# Patient Record
Sex: Male | Born: 1995 | Race: White | Hispanic: No | Marital: Single | Smoking: Never smoker
Health system: Southern US, Community
[De-identification: ages and names within clinical notes are randomized; demographics above are authoritative.]

## PROBLEM LIST (undated history)

## (undated) DIAGNOSIS — F909 Attention-deficit hyperactivity disorder, unspecified type: Secondary | ICD-10-CM

## (undated) DIAGNOSIS — J302 Other seasonal allergic rhinitis: Secondary | ICD-10-CM

## (undated) HISTORY — DX: Other seasonal allergic rhinitis: J30.2

## (undated) HISTORY — DX: Attention-deficit hyperactivity disorder, unspecified type: F90.9

---
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
## 2016-02-28 DIAGNOSIS — Z7189 Other specified counseling: Secondary | ICD-10-CM | POA: Diagnosis not present

## 2016-02-28 DIAGNOSIS — R5383 Other fatigue: Secondary | ICD-10-CM | POA: Diagnosis not present

## 2016-02-28 DIAGNOSIS — E663 Overweight: Secondary | ICD-10-CM | POA: Diagnosis not present

## 2016-02-28 DIAGNOSIS — Z1389 Encounter for screening for other disorder: Secondary | ICD-10-CM | POA: Diagnosis not present

## 2016-04-22 DIAGNOSIS — Z23 Encounter for immunization: Secondary | ICD-10-CM | POA: Diagnosis not present

## 2016-04-26 ENCOUNTER — Emergency Department
Admission: EM | Admit: 2016-04-26 | Discharge: 2016-04-27 | Disposition: A | Discharge status: Home / Self Care | Payer: Worker's Compensation | Attending: Emergency Medicine | Admitting: Emergency Medicine

## 2016-04-26 ENCOUNTER — Encounter: Payer: Self-pay | Admitting: Emergency Medicine

## 2016-04-26 ENCOUNTER — Emergency Department: Payer: Worker's Compensation

## 2016-04-26 DIAGNOSIS — S71152A Open bite, left thigh, initial encounter: Secondary | ICD-10-CM | POA: Insufficient documentation

## 2016-04-26 DIAGNOSIS — Y9389 Activity, other specified: Secondary | ICD-10-CM | POA: Insufficient documentation

## 2016-04-26 DIAGNOSIS — S70312A Abrasion, left thigh, initial encounter: Secondary | ICD-10-CM

## 2016-04-26 DIAGNOSIS — Z79899 Other long term (current) drug therapy: Secondary | ICD-10-CM | POA: Insufficient documentation

## 2016-04-26 DIAGNOSIS — F909 Attention-deficit hyperactivity disorder, unspecified type: Secondary | ICD-10-CM | POA: Insufficient documentation

## 2016-04-26 DIAGNOSIS — S81852A Open bite, left lower leg, initial encounter: Secondary | ICD-10-CM

## 2016-04-26 DIAGNOSIS — Z23 Encounter for immunization: Secondary | ICD-10-CM | POA: Insufficient documentation

## 2016-04-26 DIAGNOSIS — Y92009 Unspecified place in unspecified non-institutional (private) residence as the place of occurrence of the external cause: Secondary | ICD-10-CM | POA: Insufficient documentation

## 2016-04-26 DIAGNOSIS — W540XXA Bitten by dog, initial encounter: Secondary | ICD-10-CM | POA: Diagnosis not present

## 2016-04-26 DIAGNOSIS — Y990 Civilian activity done for income or pay: Secondary | ICD-10-CM | POA: Insufficient documentation

## 2016-04-26 MED ORDER — TETANUS-DIPHTH-ACELL PERTUSSIS 5-2.5-18.5 LF-MCG/0.5 IM SUSP
0.5000 mL | Freq: Once | INTRAMUSCULAR | Status: AC
  Administered 2016-04-26: 0.5 mL via INTRAMUSCULAR
  Filled 2016-04-26: qty 0.5

## 2016-04-26 MED ORDER — DOXYCYCLINE HYCLATE 100 MG PO TABS
100.0000 mg | ORAL_TABLET | Freq: Once | ORAL | Status: AC
  Administered 2016-04-26: 100 mg via ORAL
  Filled 2016-04-26: qty 1

## 2016-04-26 MED ORDER — DOXYCYCLINE HYCLATE 50 MG PO CAPS: 50.0000 mg | ORAL_CAPSULE | Freq: Two times a day (BID) | ORAL | 0 refills | Status: AC

## 2016-04-26 NOTE — ED Triage Notes (Addendum)
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 Has some puncture wounds, dog owner is going to check with vet on rabies status. Animal control was on the scene.

## 2016-04-26 NOTE — ED Provider Notes (Signed)
[facility] Provider Note   CSN: [id number] Arrival date & time: 04/26/16  2159     History   Chief Complaint Chief Complaint  Patient presents with  . Animal Bite    HPI Carl Cabrera is a 20 y.o. male presents to the emergency department for evaluation of dog bite to the left leg. 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 Under states dog's vaccinations are up-to-date, animal control could not verify with paperwork. Dog is currently being quarantined. Tetanus is not up-to-date. Patient's pain is 1 out of 10. He is ambulatory. No active bleeding. He denies any other injury to his body.  HPI  Past Medical History:  Diagnosis Date  . ADHD (attention deficit hyperactivity disorder)    previously on meds--no longer.(?bipolar 2005 vs ODD and anxiety) - recent retesting: ADHD (2013, Dr. Mikey Bussing psychology, rec trial stimulants)  . Seasonal allergies     Patient Active Problem List   Diagnosis Date Noted  . Persistent dry cough 05/07/2013  . HA (headache) 05/08/2012  . Atypical nevi 12/26/2011  . 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 Well adolescent visit 05/22/2011  . ADHD (attention deficit hyperactivity disorder)   . Seasonal allergies     No past surgical history on file.     Home Medications    Prior to Admission medications   Medication Sig Start Date End Date Taking? Authorizing Provider  albuterol (PROVENTIL HFA;VENTOLIN HFA) 108 (90 BASE) MCG/ACT inhaler Inhale 2 puffs into the lungs every 6 (six) hours as needed for wheezing or shortness of breath. 07/25/13   Gregor Hams, MD  azithromycin (ZITHROMAX) 250 MG tablet Take 1 tablet (250 mg total) by mouth daily. Take first 2 tablets together, then 1 every day until finished. 07/25/13   Gregor Hams, MD  doxycycline (VIBRAMYCIN) 50 MG capsule Take 1 capsule (50 mg total) by mouth 2 (two) times daily. X 5 days 04/26/16   Duanne Guess, PA-C    HYDROcodone-acetaminophen (NORCO/VICODIN) 5-325 MG per tablet Take 0.5 tablets by mouth at bedtime as needed (cough). 07/25/13   Gregor Hams, MD  ipratropium (ATROVENT) 0.06 % nasal spray Place 2 sprays into both nostrils 4 (four) times daily. 07/25/13   Gregor Hams, MD  omeprazole (PRILOSEC) 40 MG capsule Take 1 capsule (40 mg total) by mouth daily. 07/25/13   Gregor Hams, MD  predniSONE (DELTASONE) 10 MG tablet Take 3 tablets (30 mg total) by mouth daily. 07/25/13   Gregor Hams, MD    Family History Family History  Problem Relation Age of Onset  . Seizures Mother   . Mental illness Maternal Grandmother     bipolar  . Alcohol abuse Maternal Grandfather   . Alcohol abuse Paternal Grandfather   . Diabetes Paternal Grandfather   . Cancer Other     bladder cancer  . Diabetes Paternal Grandmother   . Stroke Neg Hx   . Coronary artery disease Neg Hx     Social History Social History  Substance Use Topics  . Smoking status: Never Smoker  . Smokeless tobacco: Never Used  . Alcohol use No     Allergies   Amoxicillin   Review of Systems Review of Systems  Constitutional: Negative for chills and fever.  HENT: Negative for ear pain and sore throat.   Eyes: Negative for pain and visual disturbance.  Respiratory: Negative for cough and shortness  of breath.   Cardiovascular: Negative for chest pain and palpitations.  Gastrointestinal: Negative for abdominal pain and vomiting.  Genitourinary: Negative for dysuria and hematuria.  Musculoskeletal: Negative for arthralgias and back pain.  Skin: Positive for wound. Negative for color change and rash.  Neurological: Negative for seizures and syncope.  All other systems reviewed and are negative.    Physical Exam Updated Vital Signs BP 138/76 (BP Location: Left Arm)   Pulse 76   Temp 98.5 F (36.9 C) (Oral)   Resp 18   Ht 6\' 1"  (1.854 m)   Wt 97.1 kg   SpO2 99%   BMI 28.23 kg/m   Physical Exam  Constitutional: He is oriented  to person, place, and time. He appears well-developed and well-nourished.  HENT:  Head: Normocephalic and atraumatic.  Eyes: Conjunctivae and EOM are normal. Right eye exhibits no discharge. Left eye exhibits no discharge.  Neck: Normal range of motion. Neck supple.  Pulmonary/Chest: Effort normal. No respiratory distress.  Abdominal: Soft. There is no tenderness.  Musculoskeletal: Normal range of motion. He exhibits no edema.  Examination of left posterior thigh shows a 4 x 4 centimeter area of skin abrasion with no definite puncture wounds. No warmth redness or drainage. No palpable foreign body. Wound is thoroughly irrigated and bandage applied.  Neurological: He is alert and oriented to person, place, and time.  Skin: Skin is warm and dry.  Psychiatric: He has a normal mood and affect. His behavior is normal. Judgment and thought content normal.  Nursing note and vitals reviewed.    ED Treatments / Results  Labs (all labs ordered are listed, but only abnormal results are displayed) Labs Reviewed - No data to display  EKG  EKG Interpretation None       Radiology Dg Femur Min 2 Views Left  Result Date: 04/26/2016 CLINICAL DATA:  Laceration at the posterior portion of the distal femur. EXAM: LEFT FEMUR 2 VIEWS COMPARISON:  None. FINDINGS: There is no evidence of fracture or other focal bone lesions. Soft tissues are unremarkable. IMPRESSION: Negative. Electronically Signed   By: Fidela Salisbury M.D.   On: 04/26/2016 23:31    Procedures Procedures (including critical care time)  Medications Ordered in ED Medications  doxycycline (VIBRA-TABS) tablet 100 mg (100 mg Oral Given 04/26/16 2313)  Tdap (BOOSTRIX) injection 0.5 mL (0.5 mLs Intramuscular Given 04/26/16 2314)     Initial Impression / Assessment and Plan / ED Course  I have reviewed the triage vital signs and the nursing notes.  Pertinent labs & imaging results that were available during my care of the patient  were reviewed by me and considered in my medical decision making (see chart for details).  Clinical Course    20 year old male with dog bite to the left posterior thigh. No definite puncture wounds. No palpable foreign body. X-ray showed no evidence of acute bony abnormality or foreign body. Wound is thoroughly irrigated and patient is placed on doxycycline for prophylaxis. Tetanus is updated. Patient will monitor wound for signs of infection.  Final Clinical Impressions(s) / ED Diagnoses   Final diagnoses:  Dog bite, initial encounter  Abrasion of left thigh, initial encounter  Dog bite of left lower leg, initial encounter    New Prescriptions New Prescriptions   DOXYCYCLINE (VIBRAMYCIN) 50 MG CAPSULE    Take 1 capsule (50 mg total) by mouth 2 (two) times daily. X 5 days     Duanne Guess, PA-C 04/26/16 2350    Doren Custard  Joni Fears, MD 04/26/16 [phone]

## 2016-04-26 NOTE — Discharge Instructions (Signed)
Please keep wound clean. Patient may shower. Apply antibiotic ointment daily. Return to the ER for any fevers, warmth, redness, drainage.

## 2016-08-20 DIAGNOSIS — J111 Influenza due to unidentified influenza virus with other respiratory manifestations: Secondary | ICD-10-CM | POA: Diagnosis not present

## 2017-11-21 DIAGNOSIS — Z713 Dietary counseling and surveillance: Secondary | ICD-10-CM | POA: Diagnosis not present

## 2017-11-21 DIAGNOSIS — E7800 Pure hypercholesterolemia, unspecified: Secondary | ICD-10-CM | POA: Diagnosis not present

## 2018-01-28 DIAGNOSIS — Z713 Dietary counseling and surveillance: Secondary | ICD-10-CM | POA: Diagnosis not present

## 2018-01-28 DIAGNOSIS — E7800 Pure hypercholesterolemia, unspecified: Secondary | ICD-10-CM | POA: Diagnosis not present

## 2018-04-07 DIAGNOSIS — Z23 Encounter for immunization: Secondary | ICD-10-CM | POA: Diagnosis not present

## 2018-04-14 DIAGNOSIS — R05 Cough: Secondary | ICD-10-CM | POA: Diagnosis not present

## 2018-04-14 DIAGNOSIS — J329 Chronic sinusitis, unspecified: Secondary | ICD-10-CM | POA: Diagnosis not present

## 2018-04-14 DIAGNOSIS — E663 Overweight: Secondary | ICD-10-CM | POA: Diagnosis not present

## 2018-07-21 IMAGING — CR DG FEMUR 2+V*L*
5 series · 5 of 5 positions shown · non-contrast
Comparison: None.

CLINICAL DATA: Laceration at the posterior portion of the distal
femur.

EXAM:
LEFT FEMUR 2 VIEWS

[femur ap (1 of 3)]
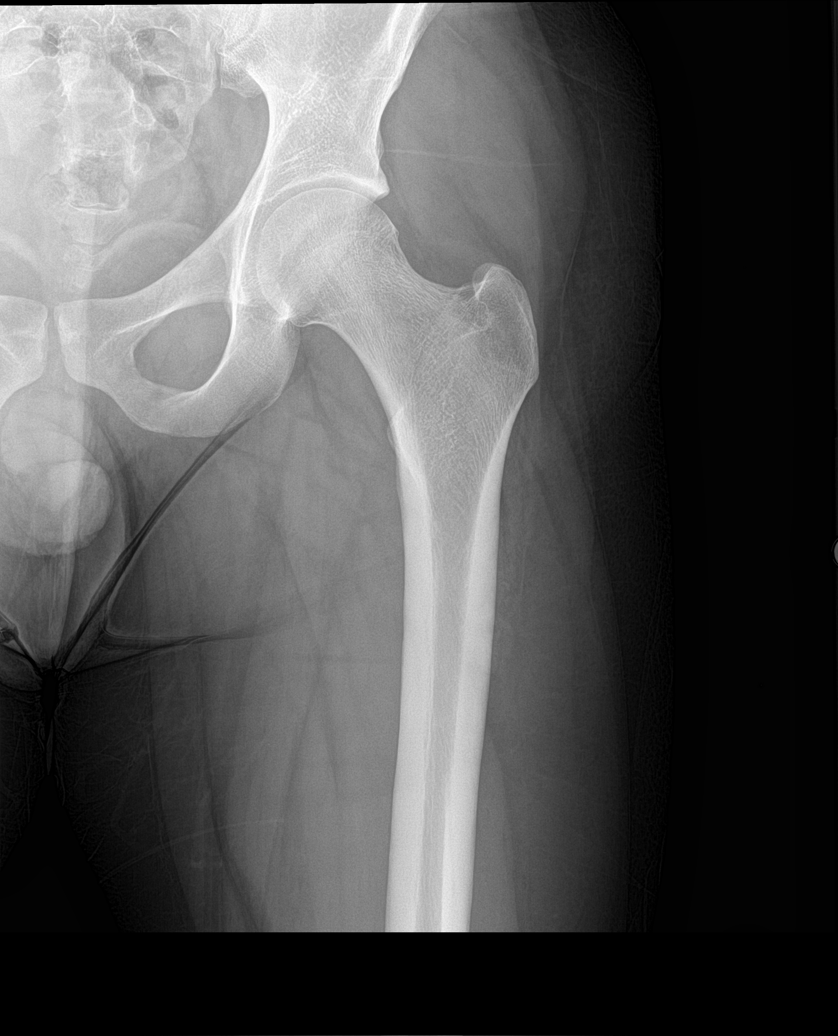

[femur ap (2 of 3)]
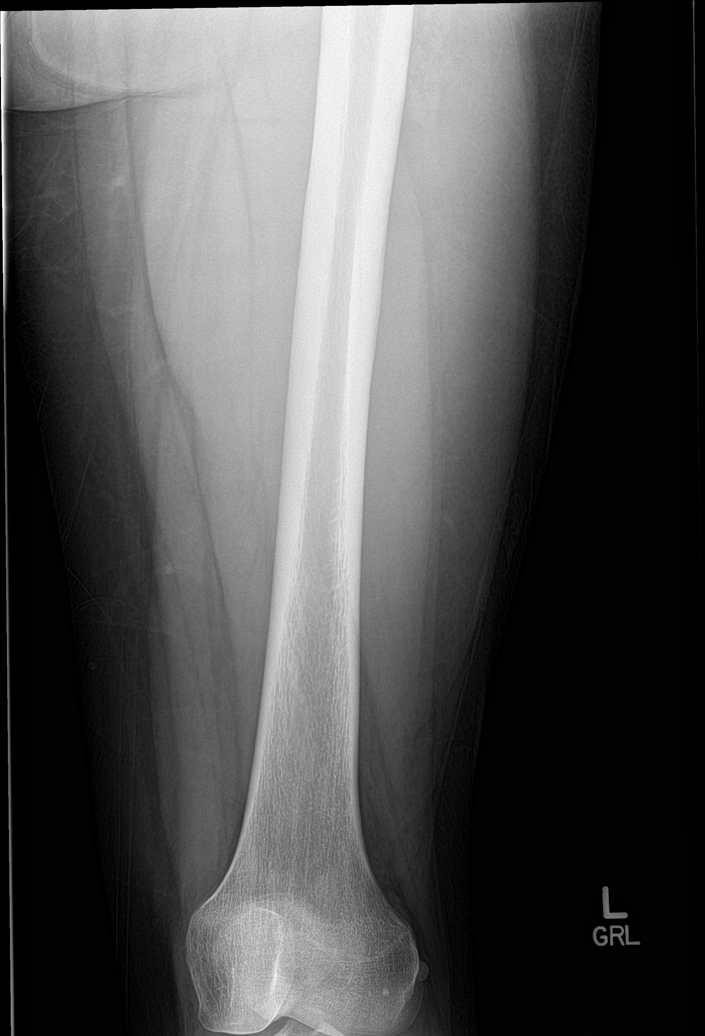

[femur lat (1 of 2)]
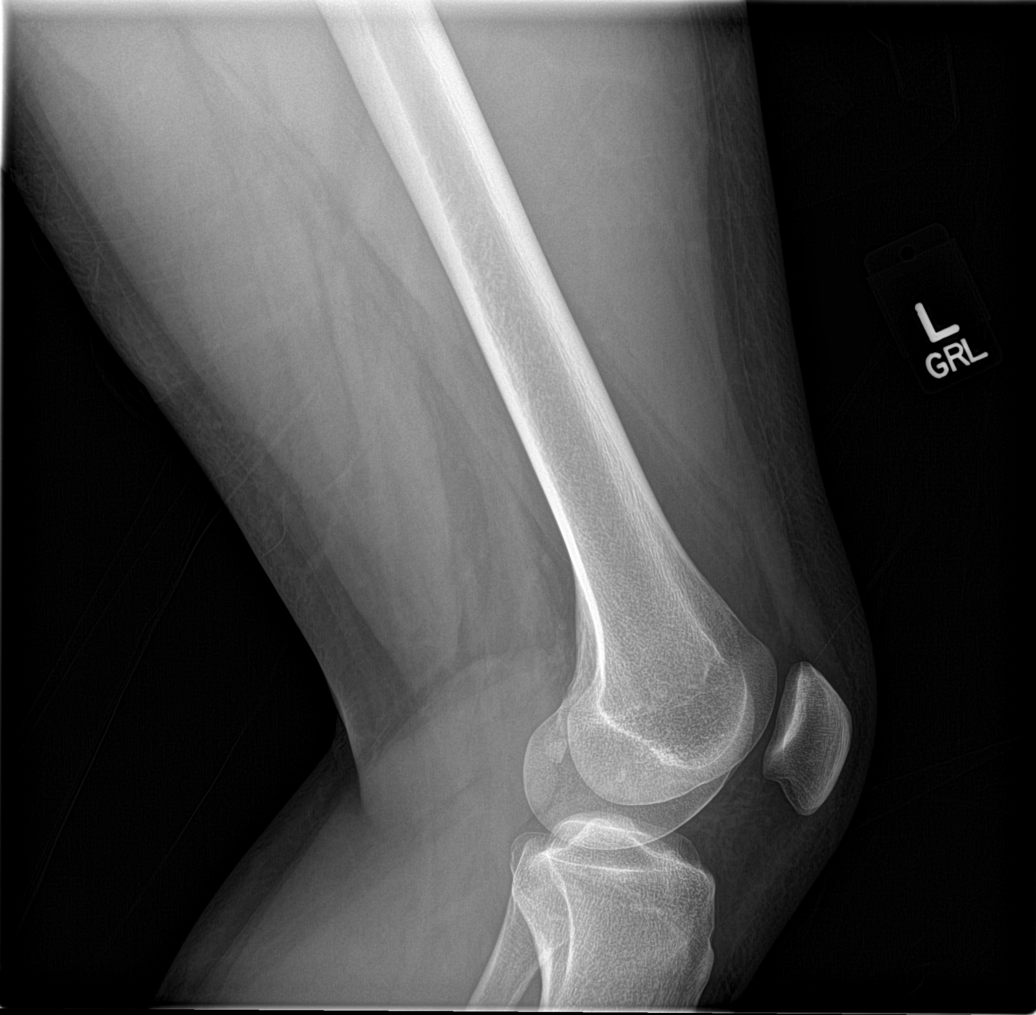

[femur lat (2 of 2)]
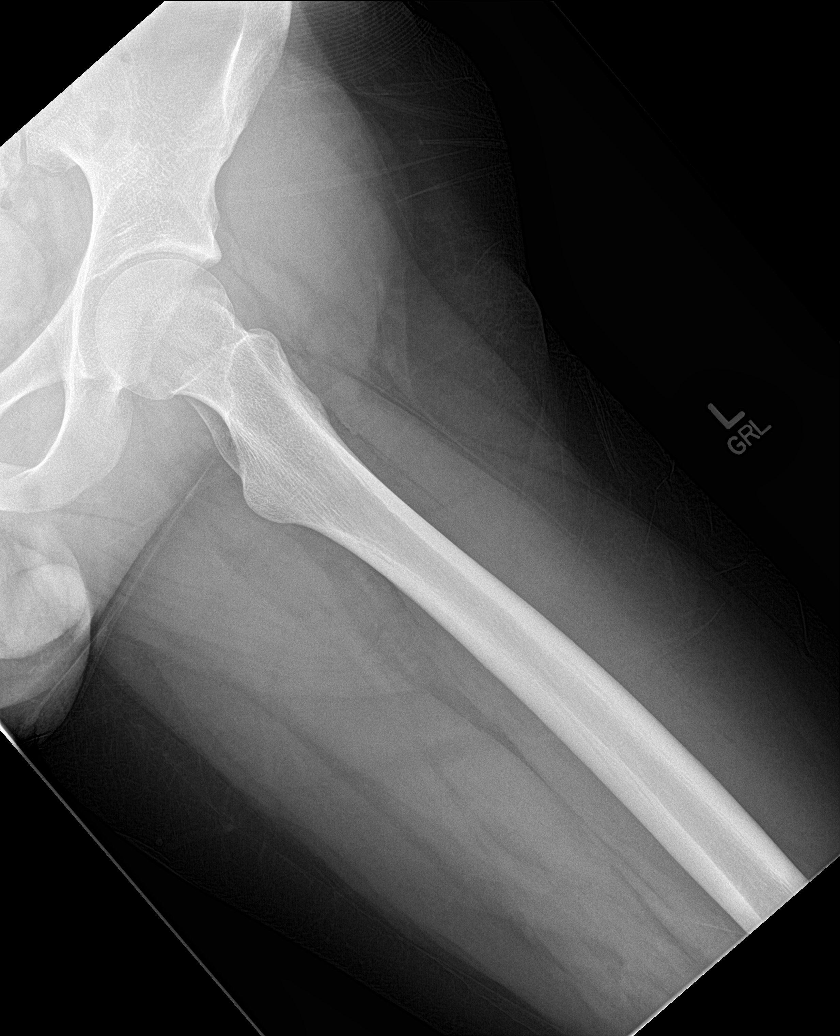

[femur ap (3 of 3)]
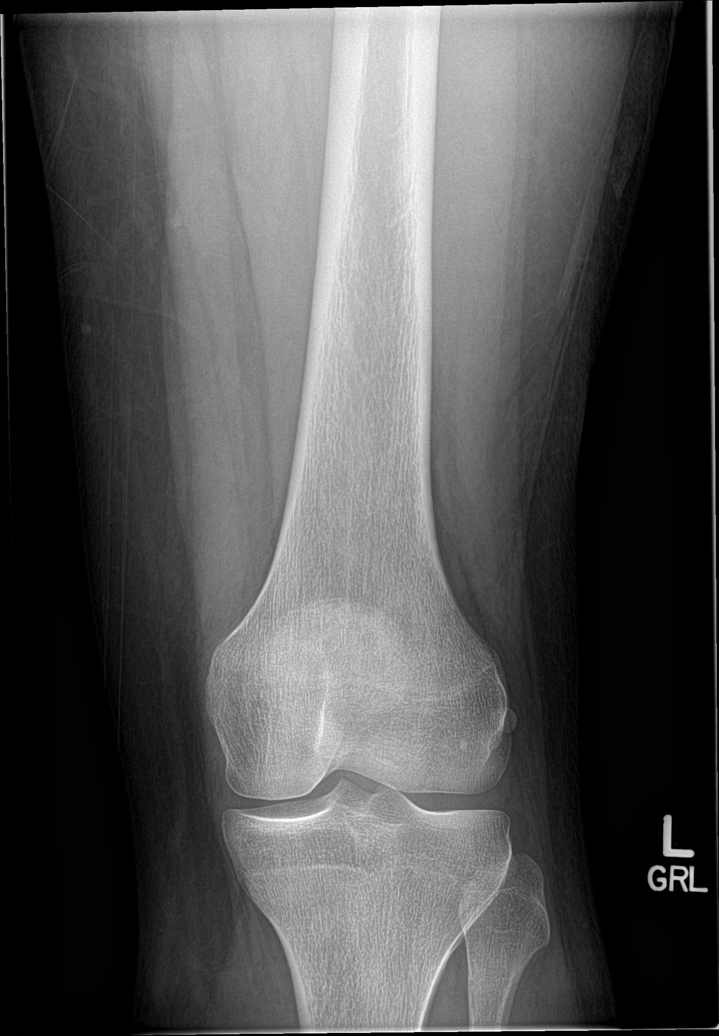

[5 of 5 positions shown; findings below may reference images not displayed]

FINDINGS: There is no evidence of fracture or other focal bone lesions. Soft
tissues are unremarkable.
IMPRESSION: Negative.

## 2018-10-12 DIAGNOSIS — R05 Cough: Secondary | ICD-10-CM | POA: Diagnosis not present

## 2021-08-29 DIAGNOSIS — R197 Diarrhea, unspecified: Secondary | ICD-10-CM | POA: Diagnosis not present

## 2021-08-29 DIAGNOSIS — Z1331 Encounter for screening for depression: Secondary | ICD-10-CM | POA: Diagnosis not present

## 2021-08-29 DIAGNOSIS — Z0000 Encounter for general adult medical examination without abnormal findings: Secondary | ICD-10-CM | POA: Diagnosis not present

## 2021-12-04 DIAGNOSIS — R52 Pain, unspecified: Secondary | ICD-10-CM | POA: Diagnosis not present

## 2021-12-04 DIAGNOSIS — R051 Acute cough: Secondary | ICD-10-CM | POA: Diagnosis not present

## 2021-12-04 DIAGNOSIS — R0981 Nasal congestion: Secondary | ICD-10-CM | POA: Diagnosis not present
